# Patient Record
Sex: Male | Born: 1965 | Race: White | Hispanic: No | Marital: Married | Smoking: Never smoker
Health system: Southern US, Community
[De-identification: ages and names within clinical notes are randomized; demographics above are authoritative.]

---

## 2018-07-21 ENCOUNTER — Other Ambulatory Visit: Payer: Self-pay

## 2018-07-21 ENCOUNTER — Encounter (HOSPITAL_BASED_OUTPATIENT_CLINIC_OR_DEPARTMENT_OTHER): Payer: Self-pay

## 2018-07-21 ENCOUNTER — Emergency Department (HOSPITAL_BASED_OUTPATIENT_CLINIC_OR_DEPARTMENT_OTHER): Payer: PRIVATE HEALTH INSURANCE

## 2018-07-21 ENCOUNTER — Emergency Department (HOSPITAL_BASED_OUTPATIENT_CLINIC_OR_DEPARTMENT_OTHER)
Admission: EM | Admit: 2018-07-21 | Discharge: 2018-07-21 | Disposition: A | Discharge status: Home / Self Care | Payer: PRIVATE HEALTH INSURANCE | Attending: Emergency Medicine | Admitting: Emergency Medicine

## 2018-07-21 DIAGNOSIS — Y998 Other external cause status: Secondary | ICD-10-CM | POA: Diagnosis not present

## 2018-07-21 DIAGNOSIS — W228XXA Striking against or struck by other objects, initial encounter: Secondary | ICD-10-CM | POA: Diagnosis not present

## 2018-07-21 DIAGNOSIS — Y92828 Other wilderness area as the place of occurrence of the external cause: Secondary | ICD-10-CM | POA: Insufficient documentation

## 2018-07-21 DIAGNOSIS — Y9355 Activity, bike riding: Secondary | ICD-10-CM | POA: Insufficient documentation

## 2018-07-21 DIAGNOSIS — S42115A Nondisplaced fracture of body of scapula, left shoulder, initial encounter for closed fracture: Secondary | ICD-10-CM | POA: Diagnosis not present

## 2018-07-21 DIAGNOSIS — S4992XA Unspecified injury of left shoulder and upper arm, initial encounter: Secondary | ICD-10-CM | POA: Diagnosis present

## 2018-07-21 MED ORDER — HYDROCODONE-ACETAMINOPHEN 5-325 MG PO TABS: 1.0000 | ORAL_TABLET | ORAL | 0 refills | Status: AC | PRN

## 2018-07-21 NOTE — ED Notes (Signed)
ED Provider at bedside. 
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
## 2018-07-21 NOTE — Discharge Instructions (Signed)
Use the shoulder sling at all times. Follow up with orthopedics next week, call on Monday
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
## 2018-07-21 NOTE — ED Notes (Signed)
Patient transported to X-ray 
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
